# Patient Record
Sex: Female | Born: 1955
Health system: Southern US, Community
[De-identification: ages and names within clinical notes are randomized; demographics above are authoritative.]

---

## 2010-05-12 ENCOUNTER — Emergency Department (HOSPITAL_COMMUNITY): Admission: EM | Admit: 2010-05-12 | Discharge: 2010-05-12 | Payer: Self-pay | Admitting: Emergency Medicine

## 2017-04-17 ENCOUNTER — Emergency Department (HOSPITAL_COMMUNITY)
Admission: EM | Admit: 2017-04-17 | Discharge: 2017-04-17 | Disposition: A | Discharge status: Home / Self Care | Payer: Self-pay | Attending: Emergency Medicine | Admitting: Emergency Medicine

## 2017-04-17 ENCOUNTER — Encounter (HOSPITAL_COMMUNITY): Payer: Self-pay | Admitting: Cardiology

## 2017-04-17 DIAGNOSIS — F17210 Nicotine dependence, cigarettes, uncomplicated: Secondary | ICD-10-CM | POA: Insufficient documentation

## 2017-04-17 DIAGNOSIS — Y999 Unspecified external cause status: Secondary | ICD-10-CM | POA: Insufficient documentation

## 2017-04-17 DIAGNOSIS — S91011A Laceration without foreign body, right ankle, initial encounter: Secondary | ICD-10-CM

## 2017-04-17 DIAGNOSIS — IMO0002 Reserved for concepts with insufficient information to code with codable children: Secondary | ICD-10-CM

## 2017-04-17 DIAGNOSIS — W208XXA Other cause of strike by thrown, projected or falling object, initial encounter: Secondary | ICD-10-CM | POA: Insufficient documentation

## 2017-04-17 DIAGNOSIS — Y9389 Activity, other specified: Secondary | ICD-10-CM | POA: Insufficient documentation

## 2017-04-17 DIAGNOSIS — Y92009 Unspecified place in unspecified non-institutional (private) residence as the place of occurrence of the external cause: Secondary | ICD-10-CM | POA: Insufficient documentation

## 2017-04-17 DIAGNOSIS — S96821A Laceration of other specified muscles and tendons at ankle and foot level, right foot, initial encounter: Secondary | ICD-10-CM | POA: Insufficient documentation

## 2017-04-17 MED ORDER — AMOXICILLIN-POT CLAVULANATE 875-125 MG PO TABS: 1.0000 | ORAL_TABLET | Freq: Two times a day (BID) | ORAL | 0 refills | Status: DC

## 2017-04-17 NOTE — ED Triage Notes (Signed)
Lamp fell and hit right ankle last night.  Pt states she can't get it to stop bleeding.

## 2017-04-17 NOTE — Discharge Instructions (Signed)
As discussed, you have a tendon injury which will require repair.  Call Dr. Roda Shutters for an appointment this week for an office visit.  Take the antibiotics prescribed.  Keep your wound clean and dry and covered.  Wear the cam walker to protect your injury.

## 2017-05-07 NOTE — ED Provider Notes (Signed)
[facility] Provider Note   CSN: [id number] Arrival date & time: 04/17/17  1237     History   Chief Complaint Chief Complaint  Patient presents with  . Laceration    HPI Kimberly Cuevas is a 61 y.o. female who sustained a laceration to her dorsal right ankle more than 12 hours ago.  A lamp fell striking her foot causing injury. She cleaned  And bandaged the wound and planned to left it heal but it will not stop bleeding.  She denies weakness or numbness distal to the injury site. She is unsure of the date of her last tetanus shot. She reports there was broken glass from the light bulb but it was the base of the lamp that caused the injury.  The history is provided by the patient.    History reviewed. No pertinent past medical history.  There are no active problems to display for this patient.   History reviewed. No pertinent surgical history.  OB History    No data available       Home Medications    Prior to Admission medications   Medication Sig Start Date End Date Taking? Authorizing Provider  amoxicillin-clavulanate (AUGMENTIN) 875-125 MG tablet Take 1 tablet by mouth every 12 (twelve) hours. 04/17/17   Burgess Amor, PA-C    Family History History reviewed. No pertinent family history.  Social History Social History  Substance Use Topics  . Smoking status: Current Every Day Smoker  . Smokeless tobacco: Never Used  . Alcohol use No     Allergies   Patient has no known allergies.   Review of Systems Review of Systems  Constitutional: Negative for chills and fever.  Respiratory: Negative for shortness of breath and wheezing.   Skin: Positive for wound.  Neurological: Negative for numbness.     Physical Exam Updated Vital Signs BP 120/70   Pulse 78   Temp 97.9 F (36.6 C) (Temporal)   Resp 20   Ht  (1.651 m)   Wt 49.9 kg (110 lb)   SpO2 100%   BMI 18.30 kg/m   Physical Exam  Constitutional: She is oriented to person, place, and  time. She appears well-developed and well-nourished.  HENT:  Head: Normocephalic.  Cardiovascular: Normal rate.   Pulmonary/Chest: Effort normal.  Musculoskeletal: She exhibits tenderness.       Feet:  Neurological: She is alert and oriented to person, place, and time. No sensory deficit.  Sensation intact in toes, less than 2 sec cap refill.  Skin: Laceration noted.     ED Treatments / Results  Labs (all labs ordered are listed, but only abnormal results are displayed) Labs Reviewed - No data to display  EKG  EKG Interpretation None       Radiology No results found.  Procedures Procedures (including critical care time)  Medications Ordered in ED Medications - No data to display   Initial Impression / Assessment and Plan / ED Course  I have reviewed the triage vital signs and the nursing notes.  Pertinent labs & imaging results that were available during my care of the patient were reviewed by me and considered in my medical decision making (see chart for details).     Pt refused imaging and refused tetanus booster. Discussed dangers of tetanus infection, pt still refusing.  She did agree with dressing and cam walker as recommended by Dr. Roda Shutters after conversation with him.  He will f/u pt in office for further management, pt to call  for appt. Augmentin started. Xeroform and dressing prior to cam walker application.  Wound is not amenable to suture repair at this time given timing, avulsive nature of wound and tendon injury.   Final Clinical Impressions(s) / ED Diagnoses   Final diagnoses:  Tendon laceration  Laceration of right ankle, initial encounter    New Prescriptions Discharge Medication List as of 04/17/2017  3:45 PM    START taking these medications   Details  amoxicillin-clavulanate (AUGMENTIN) 875-125 MG tablet Take 1 tablet by mouth every 12 (twelve) hours., Starting Tue 04/17/2017, Print         Burgess Amor, PA-C 05/07/17 1139    Loren Racer, MD 05/07/17 [phone]

## 2019-09-29 ENCOUNTER — Ambulatory Visit: Payer: Self-pay | Attending: Internal Medicine

## 2019-09-29 DIAGNOSIS — Z23 Encounter for immunization: Secondary | ICD-10-CM | POA: Insufficient documentation

## 2019-09-29 NOTE — Progress Notes (Signed)
   Covid-19 Vaccination Clinic  Name:  [place] Buxbaum    MRN: [id number] DOB: 08/28/1954  09/29/2019  Ms. Pettey was observed post Covid-19 immunization for 15 minutes without incidence. She was provided with Vaccine Information Sheet and instruction to access the V-Safe system.   Ms. Koloski was instructed to call 911 with any severe reactions post vaccine: Marland Kitchen Difficulty breathing  . Swelling of your face and throat  . A fast heartbeat  . A bad rash all over your body  . Dizziness and weakness    Immunizations Administered    Name Date Dose VIS Date Route   Pfizer COVID-19 Vaccine 09/29/2019 12:54 PM 0.3 mL 07/11/2019 Intramuscular   Manufacturer: [employer], [employer]   Lot: [id number]   NDC: [address]-2

## 2019-10-22 ENCOUNTER — Ambulatory Visit: Payer: Self-pay | Attending: Internal Medicine

## 2019-10-22 DIAGNOSIS — Z23 Encounter for immunization: Secondary | ICD-10-CM

## 2019-10-22 NOTE — Progress Notes (Signed)
   Covid-19 Vaccination Clinic  Name:  Kimberly Cuevas    MRN: [id number] DOB: 08/28/1954  10/22/2019  [name] was observed post Covid-19 immunization for 15 minutes without incident. She was provided with Vaccine Information Sheet and instruction to access the V-Safe system.   [name] was instructed to call 911 with any severe reactions post vaccine: Marland Kitchen Difficulty breathing  . Swelling of face and throat  . A fast heartbeat  . A bad rash all over body  . Dizziness and weakness   Immunizations Administered    Name Date Dose VIS Date Route   Pfizer COVID-19 Vaccine 10/22/2019 12:16 PM 0.3 mL 07/11/2019 Intramuscular   Manufacturer: [employer], [employer]   Lot: [id number]   NDC: [address]-1

## 2021-02-02 ENCOUNTER — Ambulatory Visit: Payer: Self-pay | Admitting: Nurse Practitioner

## 2021-02-04 ENCOUNTER — Encounter (HOSPITAL_COMMUNITY): Payer: Self-pay | Admitting: Emergency Medicine

## 2021-02-04 ENCOUNTER — Other Ambulatory Visit: Payer: Self-pay

## 2021-02-04 ENCOUNTER — Ambulatory Visit (HOSPITAL_COMMUNITY)
Admission: EM | Admit: 2021-02-04 | Discharge: 2021-02-04 | Disposition: A | Discharge status: Home / Self Care | Payer: Medicare HMO

## 2021-02-04 DIAGNOSIS — M545 Low back pain, unspecified: Secondary | ICD-10-CM

## 2021-02-04 DIAGNOSIS — R0602 Shortness of breath: Secondary | ICD-10-CM | POA: Diagnosis not present

## 2021-02-04 DIAGNOSIS — R059 Cough, unspecified: Secondary | ICD-10-CM | POA: Diagnosis not present

## 2021-02-04 MED ORDER — LORATADINE 10 MG PO TABS: 10.0000 mg | ORAL_TABLET | Freq: Every day | ORAL | 1 refills | Status: DC

## 2021-02-04 MED ORDER — BUDESONIDE-FORMOTEROL FUMARATE 160-4.5 MCG/ACT IN AERO: 2.0000 | INHALATION_SPRAY | Freq: Two times a day (BID) | RESPIRATORY_TRACT | 1 refills | Status: DC

## 2021-02-04 MED ORDER — MELOXICAM 7.5 MG PO TABS: 7.5000 mg | ORAL_TABLET | Freq: Every day | ORAL | 0 refills | Status: AC

## 2021-02-04 MED ORDER — ALBUTEROL SULFATE HFA 108 (90 BASE) MCG/ACT IN AERS: 2.0000 | INHALATION_SPRAY | RESPIRATORY_TRACT | 0 refills | Status: DC | PRN

## 2021-02-04 MED ORDER — CYCLOBENZAPRINE HCL 10 MG PO TABS: 10.0000 mg | ORAL_TABLET | Freq: Every day | ORAL | 0 refills | Status: DC

## 2021-02-04 NOTE — ED Provider Notes (Signed)
[facility]    CSN: [id number] Arrival date & time: 02/04/21  1410      History   Chief Complaint Chief Complaint  Patient presents with   Cough   Back Pain    HPI Kimberly Cuevas is a 65 y.o. female.   Patient presents with chronic productive cough, intermittent shortness of breath, and intermittent wheezing that has been occurring years, greater than 5. symptoms have worsened over the last few weeks due to the heat.  Has been using albuterol inhaler more frequently but has ran out of medication.  Denies chest tightness, chest pain, fevers, chills, headache, palpitations.  Has history of asthma, has not been evaluated in years.  Concern with intermittent right-sided low back pain that radiates down right leg but does not go all the way down to the toes that has been occurring for 4 months.  Makes it difficult to walk but able to bear weight.  Worsened by lying down.  Nothing makes it to feel better.  Range of motion of back intact.  Has not attempted treatment.  Denies numbness, tingling.  No bowel changes.  History reviewed. No pertinent past medical history.  There are no problems to display for this patient.   History reviewed. No pertinent surgical history.  OB History   No obstetric history on file.      Home Medications    Prior to Admission medications   Medication Sig Start Date End Date Taking? Authorizing Provider  cyclobenzaprine (FLEXERIL) 10 MG tablet Take 1 tablet (10 mg total) by mouth at bedtime. 02/04/21  Yes Lilee Aldea R, NP  meloxicam (MOBIC) 7.5 MG tablet Take 1 tablet (7.5 mg total) by mouth daily. 02/04/21  Yes Lillymae Duet R, NP  albuterol (VENTOLIN HFA) 108 (90 Base) MCG/ACT inhaler Inhale 2 puffs into the lungs every 4 (four) hours as needed for wheezing or shortness of breath. 02/04/21   Valinda Hoar, NP  budesonide-formoterol (SYMBICORT) 160-4.5 MCG/ACT inhaler Inhale 2 puffs into the lungs 2 (two) times daily. 02/04/21   Georgianne Gritz,  Elita Boone, NP  loratadine (CLARITIN) 10 MG tablet Take 1 tablet (10 mg total) by mouth daily. 02/04/21   Valinda Hoar, NP    Family History History reviewed. No pertinent family history.  Social History Social History   Tobacco Use   Smoking status: Some Days    Packs/day: 0.50    Pack years: 0.00    Types: Cigarettes   Smokeless tobacco: Never  Substance Use Topics   Alcohol use: No   Drug use: No     Allergies   Patient has no known allergies.   Review of Systems Review of Systems Deferred HPI   Physical Exam Triage Vital Signs ED Triage Vitals [02/04/21 1429]  Enc Vitals Group     BP 139/74     Pulse Rate 84     Resp 14     Temp 98.9 F (37.2 C)     Temp Source Oral     SpO2 97 %     Weight      Height      Head Circumference      Peak Flow      Pain Score 8     Pain Loc      Pain Edu?      Excl. in GC?    No data found.  Updated Vital Signs BP 139/74 (BP Location: Right Arm)   Pulse 84   Temp 98.9 F (37.2  C) (Oral)   Resp 14   SpO2 97%   Visual Acuity Right Eye Distance:   Left Eye Distance:   Bilateral Distance:    Right Eye Near:   Left Eye Near:    Bilateral Near:     Physical Exam Constitutional:      Appearance: Normal appearance. She is normal weight.  HENT:     Head: Normocephalic.  Eyes:     Extraocular Movements: Extraocular movements intact.  Cardiovascular:     Rate and Rhythm: Normal rate and regular rhythm.     Pulses: Normal pulses.     Heart sounds: Normal heart sounds.  Pulmonary:     Effort: Pulmonary effort is normal.     Breath sounds: Examination of the right-upper field reveals wheezing. Examination of the left-upper field reveals wheezing. Wheezing present.  Musculoskeletal:       Back:     Comments: Tenderness over right area of back, 1 cm small nodule located in right lower region firm and non fluctuant, does not feel fluid-filled  Skin:    General: Skin is warm and dry.  Neurological:      Mental Status: She is alert and oriented to person, place, and time. Mental status is at baseline.  Psychiatric:        Mood and Affect: Mood normal.        Behavior: Behavior normal.     UC Treatments / Results  Labs (all labs ordered are listed, but only abnormal results are displayed) Labs Reviewed - No data to display  EKG   Radiology No results found.  Procedures Procedures (including critical care time)  Medications Ordered in UC Medications - No data to display  Initial Impression / Assessment and Plan / UC Course  I have reviewed the triage vital signs and the nursing notes.  Pertinent labs & imaging results that were available during my care of the patient were reviewed by me and considered in my medical decision making (see chart for details).  Acute right-sided low back pain without sciatica Cough Shortness of breath   1.  Refill Symbicort inhaler, instructed patient on usage 2.  Prescribed albuterol HFA inhaler 2 puffs every 4 hours as needed 3.  Claritin 10 mg daily 4.  Continue use of Mucinex as needed to help with congestion 5.  Meloxicam 7.5 mg daily. 6.  Flexeril 10 mg at bedtime. 7.Follow-up with orthopedic specialist for persistent pain Final Clinical Impressions(s) / UC Diagnoses   Final diagnoses:  Acute right-sided low back pain without sciatica  Cough  Shortness of breath     Discharge Instructions      Use Symbicort inhaler 2 puffs every morning and every evening every day  Can use 2 puffs of albuterol inhaler every 4 hours as needed when you are getting shortness of breath  Take Claritin once every day  Can continue use of Mucinex as needed to help with congestion  Take meloxicam once a day for at least 5 days then can use medication as needed to help with back pain  Can use muscle relaxer at bedtime as needed for comfort, be mindful this may make you drowsy, if this occurs you may break the pill in half and take half of  the  If pain persist please follow-up with orthopedic specialist, information listed below, for further evaluation   ED Prescriptions     Medication Sig Dispense Auth. Provider   budesonide-formoterol (SYMBICORT) 160-4.5 MCG/ACT inhaler  (Status: Discontinued) Inhale 2  puffs into the lungs 2 (two) times daily. 1 each Valinda Hoar, NP   albuterol (VENTOLIN HFA) 108 (90 Base) MCG/ACT inhaler  (Status: Discontinued) Inhale 2 puffs into the lungs every 4 (four) hours as needed for wheezing or shortness of breath. 18 g Shambhavi Salley R, NP   loratadine (CLARITIN) 10 MG tablet  (Status: Discontinued) Take 1 tablet (10 mg total) by mouth daily. 30 tablet Jerritt Cardoza R, NP   albuterol (VENTOLIN HFA) 108 (90 Base) MCG/ACT inhaler  (Status: Discontinued) Inhale 2 puffs into the lungs every 4 (four) hours as needed for wheezing or shortness of breath. 18 g Desera Graffeo R, NP   budesonide-formoterol (SYMBICORT) 160-4.5 MCG/ACT inhaler  (Status: Discontinued) Inhale 2 puffs into the lungs 2 (two) times daily. 1 each Valinda Hoar, NP   loratadine (CLARITIN) 10 MG tablet  (Status: Discontinued) Take 1 tablet (10 mg total) by mouth daily. 30 tablet Raymona Boss R, NP   meloxicam (MOBIC) 7.5 MG tablet Take 1 tablet (7.5 mg total) by mouth daily. 30 tablet Shakeerah Gradel R, NP   cyclobenzaprine (FLEXERIL) 10 MG tablet Take 1 tablet (10 mg total) by mouth at bedtime. 10 tablet Treina Arscott R, NP   albuterol (VENTOLIN HFA) 108 (90 Base) MCG/ACT inhaler Inhale 2 puffs into the lungs every 4 (four) hours as needed for wheezing or shortness of breath. 18 g Rodell Marrs, Hansel Starling R, NP   budesonide-formoterol (SYMBICORT) 160-4.5 MCG/ACT inhaler Inhale 2 puffs into the lungs 2 (two) times daily. 1 each Valinda Hoar, NP   loratadine (CLARITIN) 10 MG tablet Take 1 tablet (10 mg total) by mouth daily. 30 tablet Valinda Hoar, NP      PDMP not reviewed this encounter.   Valinda Hoar,  NP 02/04/21 1550

## 2021-02-04 NOTE — Discharge Instructions (Addendum)
Use Symbicort inhaler 2 puffs every morning and every evening every day  Can use 2 puffs of albuterol inhaler every 4 hours as needed when you are getting shortness of breath  Take Claritin once every day  Can continue use of Mucinex as needed to help with congestion  Take meloxicam once a day for at least 5 days then can use medication as needed to help with back pain  Can use muscle relaxer at bedtime as needed for comfort, be mindful this may make you drowsy, if this occurs you may break the pill in half and take half of the  If pain persist please follow-up with orthopedic specialist, information listed below, for further evaluation

## 2021-02-04 NOTE — ED Triage Notes (Signed)
C/o chronic cough, needs albuterol inhaler refilled. States a couple days ago she coughed up some yellow tinged sputum, but that that occurrence is occasionally considered baseline for her. C/o low back pain starting four months ago that increases with movement/weight bearing, radiates down leg.

## 2021-03-15 DIAGNOSIS — Z20822 Contact with and (suspected) exposure to covid-19: Secondary | ICD-10-CM | POA: Diagnosis not present

## 2021-04-27 ENCOUNTER — Other Ambulatory Visit: Payer: Self-pay

## 2021-04-27 ENCOUNTER — Other Ambulatory Visit: Payer: Self-pay | Admitting: Family Medicine

## 2021-04-27 ENCOUNTER — Ambulatory Visit
Admission: RE | Admit: 2021-04-27 | Discharge: 2021-04-27 | Disposition: A | Discharge status: Home / Self Care | Payer: Medicare HMO | Source: Ambulatory Visit | Attending: Family Medicine | Admitting: Family Medicine

## 2021-04-27 DIAGNOSIS — R635 Abnormal weight gain: Secondary | ICD-10-CM | POA: Diagnosis not present

## 2021-04-27 DIAGNOSIS — R69 Illness, unspecified: Secondary | ICD-10-CM | POA: Diagnosis not present

## 2021-04-27 DIAGNOSIS — M130 Polyarthritis, unspecified: Secondary | ICD-10-CM | POA: Diagnosis not present

## 2021-04-27 DIAGNOSIS — M549 Dorsalgia, unspecified: Secondary | ICD-10-CM

## 2021-04-27 DIAGNOSIS — J449 Chronic obstructive pulmonary disease, unspecified: Secondary | ICD-10-CM

## 2021-04-27 DIAGNOSIS — Z720 Tobacco use: Secondary | ICD-10-CM | POA: Diagnosis not present

## 2021-04-27 DIAGNOSIS — M545 Low back pain, unspecified: Secondary | ICD-10-CM | POA: Diagnosis not present

## 2021-04-27 DIAGNOSIS — J439 Emphysema, unspecified: Secondary | ICD-10-CM | POA: Diagnosis not present

## 2021-04-27 DIAGNOSIS — J441 Chronic obstructive pulmonary disease with (acute) exacerbation: Secondary | ICD-10-CM | POA: Diagnosis not present

## 2021-04-27 DIAGNOSIS — E559 Vitamin D deficiency, unspecified: Secondary | ICD-10-CM | POA: Diagnosis not present

## 2021-04-27 DIAGNOSIS — I10 Essential (primary) hypertension: Secondary | ICD-10-CM | POA: Diagnosis not present

## 2021-05-11 DIAGNOSIS — Z2821 Immunization not carried out because of patient refusal: Secondary | ICD-10-CM | POA: Diagnosis not present

## 2021-05-11 DIAGNOSIS — M130 Polyarthritis, unspecified: Secondary | ICD-10-CM | POA: Diagnosis not present

## 2021-05-11 DIAGNOSIS — Z681 Body mass index (BMI) 19 or less, adult: Secondary | ICD-10-CM | POA: Diagnosis not present

## 2021-05-11 DIAGNOSIS — E559 Vitamin D deficiency, unspecified: Secondary | ICD-10-CM | POA: Diagnosis not present

## 2021-05-11 DIAGNOSIS — R69 Illness, unspecified: Secondary | ICD-10-CM | POA: Diagnosis not present

## 2021-05-11 DIAGNOSIS — R635 Abnormal weight gain: Secondary | ICD-10-CM | POA: Diagnosis not present

## 2021-05-11 DIAGNOSIS — J441 Chronic obstructive pulmonary disease with (acute) exacerbation: Secondary | ICD-10-CM | POA: Diagnosis not present

## 2021-05-11 DIAGNOSIS — Z0000 Encounter for general adult medical examination without abnormal findings: Secondary | ICD-10-CM | POA: Diagnosis not present

## 2021-05-25 DIAGNOSIS — M130 Polyarthritis, unspecified: Secondary | ICD-10-CM | POA: Diagnosis not present

## 2021-05-25 DIAGNOSIS — J441 Chronic obstructive pulmonary disease with (acute) exacerbation: Secondary | ICD-10-CM | POA: Diagnosis not present

## 2021-05-25 DIAGNOSIS — R69 Illness, unspecified: Secondary | ICD-10-CM | POA: Diagnosis not present

## 2021-06-15 DIAGNOSIS — Z1211 Encounter for screening for malignant neoplasm of colon: Secondary | ICD-10-CM | POA: Diagnosis not present

## 2021-06-20 DIAGNOSIS — Z1211 Encounter for screening for malignant neoplasm of colon: Secondary | ICD-10-CM | POA: Diagnosis not present

## 2021-06-27 DIAGNOSIS — M130 Polyarthritis, unspecified: Secondary | ICD-10-CM | POA: Diagnosis not present

## 2021-06-27 DIAGNOSIS — R69 Illness, unspecified: Secondary | ICD-10-CM | POA: Diagnosis not present

## 2021-07-14 ENCOUNTER — Other Ambulatory Visit (HOSPITAL_COMMUNITY): Payer: Self-pay | Admitting: Radiology

## 2021-07-14 DIAGNOSIS — J441 Chronic obstructive pulmonary disease with (acute) exacerbation: Secondary | ICD-10-CM

## 2021-08-11 ENCOUNTER — Encounter (HOSPITAL_COMMUNITY): Payer: Self-pay | Admitting: Emergency Medicine

## 2021-08-11 ENCOUNTER — Emergency Department (HOSPITAL_COMMUNITY): Payer: Medicare Other

## 2021-08-11 ENCOUNTER — Emergency Department (HOSPITAL_COMMUNITY)
Admission: EM | Admit: 2021-08-11 | Discharge: 2021-08-11 | Disposition: A | Discharge status: Home / Self Care | Payer: Medicare Other | Attending: Emergency Medicine | Admitting: Emergency Medicine

## 2021-08-11 ENCOUNTER — Other Ambulatory Visit: Payer: Self-pay

## 2021-08-11 DIAGNOSIS — R059 Cough, unspecified: Secondary | ICD-10-CM | POA: Diagnosis not present

## 2021-08-11 DIAGNOSIS — J439 Emphysema, unspecified: Secondary | ICD-10-CM | POA: Diagnosis not present

## 2021-08-11 DIAGNOSIS — Z20822 Contact with and (suspected) exposure to covid-19: Secondary | ICD-10-CM | POA: Diagnosis not present

## 2021-08-11 DIAGNOSIS — R509 Fever, unspecified: Secondary | ICD-10-CM | POA: Diagnosis not present

## 2021-08-11 DIAGNOSIS — M546 Pain in thoracic spine: Secondary | ICD-10-CM | POA: Insufficient documentation

## 2021-08-11 DIAGNOSIS — Z5321 Procedure and treatment not carried out due to patient leaving prior to being seen by health care provider: Secondary | ICD-10-CM | POA: Diagnosis not present

## 2021-08-11 DIAGNOSIS — R0602 Shortness of breath: Secondary | ICD-10-CM | POA: Diagnosis not present

## 2021-08-11 DIAGNOSIS — J449 Chronic obstructive pulmonary disease, unspecified: Secondary | ICD-10-CM | POA: Diagnosis not present

## 2021-08-11 LAB — COMPREHENSIVE METABOLIC PANEL
ALT: 18 U/L (ref 0–44)
AST: 19 U/L (ref 15–41)
Albumin: 3 g/dL — ABNORMAL LOW (ref 3.5–5.0)
Alkaline Phosphatase: 66 U/L (ref 38–126)
Anion gap: 10 (ref 5–15)
BUN: 9 mg/dL (ref 8–23)
CO2: 26 mmol/L (ref 22–32)
Calcium: 9.1 mg/dL (ref 8.9–10.3)
Chloride: 100 mmol/L (ref 98–111)
Creatinine, Ser: 0.83 mg/dL (ref 0.44–1.00)
GFR, Estimated: >60 mL/min (ref >60)
Glucose, Bld: 84 mg/dL (ref 70–99)
Potassium: 4.1 mmol/L (ref 3.5–5.1)
Sodium: 136 mmol/L (ref 135–145)
Total Bilirubin: 0.4 mg/dL (ref 0.3–1.2)
Total Protein: 7.8 g/dL (ref 6.5–8.1)

## 2021-08-11 LAB — CBC WITH DIFFERENTIAL/PLATELET
Abs Immature Granulocytes: 0.05 10*3/uL (ref 0.00–0.07)
Basophils Absolute: 0.1 10*3/uL (ref 0.0–0.1)
Basophils Relative: 0 %
Eosinophils Absolute: 0.1 10*3/uL (ref 0.0–0.5)
Eosinophils Relative: 1 %
HCT: 39 % (ref 36.0–46.0)
Hemoglobin: 12.5 g/dL (ref 12.0–15.0)
Immature Granulocytes: 0 %
Lymphocytes Relative: 13 %
Lymphs Abs: 1.8 10*3/uL (ref 0.7–4.0)
MCH: 32.4 pg (ref 26.0–34.0)
MCHC: 32.1 g/dL (ref 30.0–36.0)
MCV: 101 fL — ABNORMAL HIGH (ref 80.0–100.0)
Monocytes Absolute: 1 10*3/uL (ref 0.1–1.0)
Monocytes Relative: 7 %
Neutro Abs: 10.4 10*3/uL — ABNORMAL HIGH (ref 1.7–7.7)
Neutrophils Relative %: 79 %
Platelets: 327 10*3/uL (ref 150–400)
RBC: 3.86 MIL/uL — ABNORMAL LOW (ref 3.87–5.11)
RDW: 12.9 % (ref 11.5–15.5)
WBC: 13.3 10*3/uL — ABNORMAL HIGH (ref 4.0–10.5)
nRBC: 0 % (ref 0.0–0.2)

## 2021-08-11 LAB — URINALYSIS, MICROSCOPIC (REFLEX): RBC / HPF: >50 RBC/hpf (ref 0–5)

## 2021-08-11 LAB — URINALYSIS, ROUTINE W REFLEX MICROSCOPIC
Bilirubin Urine: NEGATIVE
Glucose, UA: NEGATIVE mg/dL
Ketones, ur: NEGATIVE mg/dL
Leukocytes,Ua: NEGATIVE
Nitrite: NEGATIVE
Protein, ur: NEGATIVE mg/dL
Specific Gravity, Urine: 1.025 (ref 1.005–1.030)
pH: 6 (ref 5.0–8.0)

## 2021-08-11 LAB — RESP PANEL BY RT-PCR (FLU A&B, COVID) ARPGX2
Influenza A by PCR: NEGATIVE
Influenza B by PCR: NEGATIVE
SARS Coronavirus 2 by RT PCR: NEGATIVE

## 2021-08-11 LAB — LACTIC ACID, PLASMA: Lactic Acid, Venous: 1 mmol/L (ref 0.5–1.9)

## 2021-08-11 MED ORDER — ACETAMINOPHEN 325 MG PO TABS
650.0000 mg | ORAL_TABLET | Freq: Once | ORAL | Status: AC
  Administered 2021-08-11: 650 mg via ORAL
  Filled 2021-08-11: qty 2

## 2021-08-11 NOTE — ED Provider Triage Note (Signed)
Emergency Medicine Provider Triage Evaluation Note  Kimberly Cuevas , a 66 y.o. female  was evaluated in triage.  Pt complains of shortness of breath and a cough.  Symptoms have been present and worsening over the past 3 days.  Patient was unaware she had a fever, but temp of 100.8 on arrival.  Reports cough is productive of phlegm.  Denies chest pain, reports some pain in her thoracic back with coughing.  No abdominal pain or vomiting.  Denies urinary symptoms.  No known sick contacts and patient reports she has had vaccinations for flu and COVID.  History of previous pneumonia  Review of Systems  Positive: Shortness of breath, cough, fever Negative: Abdominal pain, nausea, vomiting, chest pain, dysuria  Physical Exam  BP 138/61 (BP Location: Right Arm)    Pulse 97    Temp (!) 100.8 F (38.2 C) (Oral)    Resp (!) 24    SpO2 96%  Gen:   Awake, chronically ill-appearing Resp:  Frequent coughing during exam, decreased air movement bilaterally, no wheezing or rhonchi. MSK:   Moves extremities without difficulty  Other:    Medical Decision Making  Medically screening exam initiated at 1:42 AM.  Appropriate orders placed.  Kimberly Popov was informed that the remainder of the evaluation will be completed by another provider, this initial triage assessment does not replace that evaluation, and the importance of remaining in the ED until their evaluation is complete.  Work-up initiated, concern for potential pneumonia, versus viral respiratory infection.  Satting well on room air and not in respiratory distress.   Dartha Lodge, [place] 08/11/21 [phone]

## 2021-08-11 NOTE — ED Notes (Signed)
Called x3 for room, pt did not respond.

## 2021-08-11 NOTE — ED Triage Notes (Signed)
Pt reports cough and back pain X 1 week.  She has a fever in triage.  Negative home COVID test last week.

## 2021-09-08 ENCOUNTER — Encounter (HOSPITAL_COMMUNITY): Payer: Self-pay | Admitting: Radiology

## 2021-09-27 DIAGNOSIS — J00 Acute nasopharyngitis [common cold]: Secondary | ICD-10-CM | POA: Diagnosis not present

## 2021-10-05 DIAGNOSIS — J441 Chronic obstructive pulmonary disease with (acute) exacerbation: Secondary | ICD-10-CM | POA: Diagnosis not present

## 2021-10-05 DIAGNOSIS — M130 Polyarthritis, unspecified: Secondary | ICD-10-CM | POA: Diagnosis not present

## 2021-10-05 DIAGNOSIS — Z720 Tobacco use: Secondary | ICD-10-CM | POA: Diagnosis not present

## 2021-10-05 DIAGNOSIS — F5112 Insufficient sleep syndrome: Secondary | ICD-10-CM | POA: Diagnosis not present

## 2021-10-20 DIAGNOSIS — H524 Presbyopia: Secondary | ICD-10-CM | POA: Diagnosis not present

## 2021-10-20 DIAGNOSIS — H25813 Combined forms of age-related cataract, bilateral: Secondary | ICD-10-CM | POA: Diagnosis not present

## 2021-10-21 DIAGNOSIS — J441 Chronic obstructive pulmonary disease with (acute) exacerbation: Secondary | ICD-10-CM | POA: Diagnosis not present

## 2021-10-21 DIAGNOSIS — R053 Chronic cough: Secondary | ICD-10-CM | POA: Diagnosis not present

## 2021-10-21 DIAGNOSIS — E559 Vitamin D deficiency, unspecified: Secondary | ICD-10-CM | POA: Diagnosis not present

## 2021-12-02 DIAGNOSIS — J441 Chronic obstructive pulmonary disease with (acute) exacerbation: Secondary | ICD-10-CM | POA: Diagnosis not present

## 2021-12-02 DIAGNOSIS — J301 Allergic rhinitis due to pollen: Secondary | ICD-10-CM | POA: Diagnosis not present

## 2021-12-02 DIAGNOSIS — R053 Chronic cough: Secondary | ICD-10-CM | POA: Diagnosis not present

## 2022-01-02 DIAGNOSIS — R053 Chronic cough: Secondary | ICD-10-CM | POA: Diagnosis not present

## 2022-01-02 DIAGNOSIS — Z720 Tobacco use: Secondary | ICD-10-CM | POA: Diagnosis not present

## 2022-01-23 DIAGNOSIS — E559 Vitamin D deficiency, unspecified: Secondary | ICD-10-CM | POA: Diagnosis not present

## 2022-01-23 DIAGNOSIS — M130 Polyarthritis, unspecified: Secondary | ICD-10-CM | POA: Diagnosis not present

## 2022-01-23 DIAGNOSIS — F5112 Insufficient sleep syndrome: Secondary | ICD-10-CM | POA: Diagnosis not present

## 2022-01-23 DIAGNOSIS — R053 Chronic cough: Secondary | ICD-10-CM | POA: Diagnosis not present

## 2022-01-23 DIAGNOSIS — J441 Chronic obstructive pulmonary disease with (acute) exacerbation: Secondary | ICD-10-CM | POA: Diagnosis not present

## 2022-01-23 DIAGNOSIS — R638 Other symptoms and signs concerning food and fluid intake: Secondary | ICD-10-CM | POA: Diagnosis not present

## 2022-01-23 DIAGNOSIS — Z0000 Encounter for general adult medical examination without abnormal findings: Secondary | ICD-10-CM | POA: Diagnosis not present

## 2022-01-26 ENCOUNTER — Other Ambulatory Visit: Payer: Self-pay | Admitting: Obstetrics and Gynecology

## 2022-01-26 DIAGNOSIS — Z122 Encounter for screening for malignant neoplasm of respiratory organs: Secondary | ICD-10-CM

## 2022-02-01 ENCOUNTER — Ambulatory Visit (INDEPENDENT_AMBULATORY_CARE_PROVIDER_SITE_OTHER): Payer: Medicare Other

## 2022-02-01 ENCOUNTER — Ambulatory Visit (INDEPENDENT_AMBULATORY_CARE_PROVIDER_SITE_OTHER): Payer: Medicare Other | Admitting: Family Medicine

## 2022-02-01 ENCOUNTER — Encounter: Payer: Self-pay | Admitting: Family Medicine

## 2022-02-01 VITALS — BP 106/58 | HR 85 | Temp 97.8°F | Ht 65.0 in | Wt 111.0 lb

## 2022-02-01 DIAGNOSIS — R053 Chronic cough: Secondary | ICD-10-CM | POA: Insufficient documentation

## 2022-02-01 DIAGNOSIS — J189 Pneumonia, unspecified organism: Secondary | ICD-10-CM

## 2022-02-01 DIAGNOSIS — J449 Chronic obstructive pulmonary disease, unspecified: Secondary | ICD-10-CM

## 2022-02-01 DIAGNOSIS — Z87891 Personal history of nicotine dependence: Secondary | ICD-10-CM | POA: Diagnosis not present

## 2022-02-01 DIAGNOSIS — R059 Cough, unspecified: Secondary | ICD-10-CM | POA: Diagnosis not present

## 2022-02-01 DIAGNOSIS — R0602 Shortness of breath: Secondary | ICD-10-CM

## 2022-02-01 LAB — CBC WITH DIFFERENTIAL/PLATELET
Basophils Absolute: 0.1 10*3/uL (ref 0.0–0.1)
Basophils Relative: 0.5 % (ref 0.0–3.0)
Eosinophils Absolute: 0.2 10*3/uL (ref 0.0–0.7)
Eosinophils Relative: 1.2 % (ref 0.0–5.0)
HCT: 37.8 % (ref 36.0–46.0)
Hemoglobin: 12.2 g/dL (ref 12.0–15.0)
Lymphocytes Relative: 15.9 % (ref 12.0–46.0)
Lymphs Abs: 2.1 10*3/uL (ref 0.7–4.0)
MCHC: 32.4 g/dL (ref 30.0–36.0)
MCV: 97.4 fl (ref 78.0–100.0)
Monocytes Absolute: 1 10*3/uL (ref 0.1–1.0)
Monocytes Relative: 8 % (ref 3.0–12.0)
Neutro Abs: 9.6 10*3/uL — ABNORMAL HIGH (ref 1.4–7.7)
Neutrophils Relative %: 74.4 % (ref 43.0–77.0)
Platelets: 324 10*3/uL (ref 150.0–400.0)
RBC: 3.87 Mil/uL (ref 3.87–5.11)
RDW: 14.2 % (ref 11.5–15.5)
WBC: 12.9 10*3/uL — ABNORMAL HIGH (ref 4.0–10.5)

## 2022-02-01 MED ORDER — AZITHROMYCIN 250 MG PO TABS: ORAL_TABLET | ORAL | 0 refills | Status: AC

## 2022-02-01 MED ORDER — PREDNISONE 20 MG PO TABS: 40.0000 mg | ORAL_TABLET | Freq: Every day | ORAL | 0 refills | Status: DC

## 2022-02-01 NOTE — Assessment & Plan Note (Signed)
Appears uncontrolled. Continue Breztri and Singulair and see pulmonologist. Current CAP per XR today. Will treat with Z-pak and oral steroids.

## 2022-02-01 NOTE — Progress Notes (Signed)
New Patient Office Visit  Subjective    Patient ID: Kimberly Cuevas, female    DOB: 10-20-1955  Age: 66 y.o. MRN: [id number]  CC:  Chief Complaint  Patient presents with   Establish Care    Has has continuous cough for about 6 months, has records with her with what she has tried and wants to find cure. States the coughing has started to effect her breathing.     HPI Kimberly Cuevas presents to establish care. She has been seeing Dr. Parke Simmers. No medical records available today.   Chronic productive cough, intermittent wheezing and shortness of breath x 6 months. Denies fever, chills, dizziness, chest pain, palpitations, abdominal pain, N/V/D, urinary symptoms, LE edema.   States she is here to see if she can get an antibiotic. Unsure if she is establishing care here or going back to see Dr. Parke Simmers. States Dr. Tedra Senegal office is closed today.   Prednisone in March 2023 which helped her some. No recent antibiotics.    Started on [place] in March or April.  Previously on Symbicort and Albuterol.   Recently started on Singulair and does not think it is helping.   Started Megace ES for poor appetite.    Stopped smoking 6 months.  Smoked for 30+  Not a heavy smoker. 1 pack every 3-4 days.    Lives alone. Children and grandchildren.  Clerical and data entry, restaurants.   From [place] originally. Lives in [place], [place], and then [place].    Outpatient Encounter Medications as of 02/01/2022  Medication Sig   azithromycin (ZITHROMAX) 250 MG tablet Take 2 tablets on day 1, then 1 tablet daily on days 2 through 5   BREZTRI AEROSPHERE 160-9-4.8 MCG/ACT AERO SMARTSIG:2 Puff(s) By Mouth Twice Daily   celecoxib (CELEBREX) 200 MG capsule Take 200 mg by mouth 2 (two) times daily.   Loratadine-Pseudoephedrine (CLARITIN-D 12 HOUR PO) Take by mouth.   megestrol (MEGACE ES) 625 MG/5ML suspension Take by mouth.   montelukast (SINGULAIR) 10 MG tablet Take 10 mg by mouth at bedtime.   predniSONE (DELTASONE) 20 MG  tablet Take 2 tablets (40 mg total) by mouth daily with breakfast.   rosuvastatin (CRESTOR) 10 MG tablet Take 10 mg by mouth daily.   meloxicam (MOBIC) 7.5 MG tablet Take 1 tablet (7.5 mg total) by mouth daily. (Patient not taking: Reported on 02/01/2022)   [DISCONTINUED] albuterol (VENTOLIN HFA) 108 (90 Base) MCG/ACT inhaler Inhale 2 puffs into the lungs every 4 (four) hours as needed for wheezing or shortness of breath.   [DISCONTINUED] budesonide-formoterol (SYMBICORT) 160-4.5 MCG/ACT inhaler Inhale 2 puffs into the lungs 2 (two) times daily.   [DISCONTINUED] cyclobenzaprine (FLEXERIL) 10 MG tablet Take 1 tablet (10 mg total) by mouth at bedtime.   [DISCONTINUED] guaiFENesin-codeine 100-10 MG/5ML syrup Take 5 mLs by mouth 3 (three) times daily.   [DISCONTINUED] loratadine (CLARITIN) 10 MG tablet Take 1 tablet (10 mg total) by mouth daily.   No facility-administered encounter medications on file as of 02/01/2022.    History reviewed. No pertinent past medical history.  History reviewed. No pertinent surgical history.  History reviewed. No pertinent family history.  Social History   Socioeconomic History   Marital status: Single    Spouse name: Not on file   Number of children: Not on file   Years of education: Not on file   Highest education level: Not on file  Occupational History   Not on file  Tobacco Use   Smoking status: Some  Days    Packs/day: 0.50    Types: Cigarettes   Smokeless tobacco: Never  Substance and Sexual Activity   Alcohol use: No   Drug use: No   Sexual activity: Not on file  Other Topics Concern   Not on file  Social History Narrative   Not on file   Social Determinants of Health   Financial Resource Strain: Not on file  Food Insecurity: Not on file  Transportation Needs: Not on file  Physical Activity: Not on file  Stress: Not on file  Social Connections: Not on file  Intimate Partner Violence: Not on file    ROS Pertinent positives and  negatives in the history of present illness.      Objective    BP (!) 106/58 (BP Location: Left Arm, Patient Position: Sitting, Cuff Size: Large)   Pulse 85   Temp 97.8 F (36.6 C) (Temporal)   Ht 5\' 5"  (1.651 m)   Wt 111 lb (50.3 kg)   SpO2 99%   BMI 18.47 kg/m   Physical Exam Constitutional:      General: She is not in acute distress.    Appearance: She is ill-appearing.  HENT:     Nose:     Right Sinus: No maxillary sinus tenderness or frontal sinus tenderness.     Left Sinus: No maxillary sinus tenderness or frontal sinus tenderness.     Mouth/Throat:     Lips: Pink.     Mouth: Mucous membranes are moist.     Pharynx: Oropharynx is clear.  Cardiovascular:     Rate and Rhythm: Normal rate and regular rhythm.  Pulmonary:     Effort: Pulmonary effort is normal.     Breath sounds: Examination of the right-upper field reveals wheezing. Examination of the left-upper field reveals wheezing. Examination of the right-lower field reveals decreased breath sounds. Examination of the left-lower field reveals decreased breath sounds. Decreased breath sounds and wheezing present.  Musculoskeletal:     Cervical back: Normal range of motion and neck supple.     Right lower leg: No edema.     Left lower leg: No edema.  Lymphadenopathy:     Cervical: No cervical adenopathy.  Skin:    General: Skin is warm and dry.  Neurological:     General: No focal deficit present.     Mental Status: She is alert and oriented to person, place, and time.  Psychiatric:        Mood and Affect: Mood normal.        Speech: Speech normal.        Behavior: Behavior normal.        Cognition and Memory: Cognition normal.         Assessment & Plan:   Problem List Items Addressed This Visit       Respiratory   Chronic obstructive pulmonary disease (HCC)    Appears uncontrolled. Continue Breztri and Singulair and see pulmonologist. Current CAP per XR today. Will treat with Z-pak and oral  steroids.       Relevant Medications   BREZTRI AEROSPHERE 160-9-4.8 MCG/ACT AERO   montelukast (SINGULAIR) 10 MG tablet   Loratadine-Pseudoephedrine (CLARITIN-D 12 HOUR PO)   azithromycin (ZITHROMAX) 250 MG tablet   predniSONE (DELTASONE) 20 MG tablet   Other Relevant Orders   DG Chest 2 View (Completed)   Ambulatory referral to Pulmonology   Community acquired pneumonia - Primary    Stat CBC and chest XR ordered. Bilateral pneumonia per XR.  Z-pak and oral steroids prescribed. Continue COPD medications. Urgent referral to pulmonologist due to nodular appearance of lung on XR and uncontrolled COPD with chronic cough. CT chest has been ordered by another provider but patient has not had this done. Follow up if worsening or not improving on medications in 2-3 days.        Relevant Medications   BREZTRI AEROSPHERE 160-9-4.8 MCG/ACT AERO   montelukast (SINGULAIR) 10 MG tablet   Loratadine-Pseudoephedrine (CLARITIN-D 12 HOUR PO)   azithromycin (ZITHROMAX) 250 MG tablet     Other   Chronic cough   Relevant Medications   azithromycin (ZITHROMAX) 250 MG tablet   predniSONE (DELTASONE) 20 MG tablet   Other Relevant Orders   DG Chest 2 View (Completed)   CBC with Differential/Platelet (Completed)   Ambulatory referral to Pulmonology   Other Visit Diagnoses     Former smoker       Relevant Orders   DG Chest 2 View (Completed)   Ambulatory referral to Pulmonology   Shortness of breath       Relevant Medications   azithromycin (ZITHROMAX) 250 MG tablet   predniSONE (DELTASONE) 20 MG tablet   Other Relevant Orders   DG Chest 2 View (Completed)   CBC with Differential/Platelet (Completed)   Ambulatory referral to Pulmonology       Return if symptoms worsen or fail to improve.   Hetty Blend, NP-C

## 2022-02-01 NOTE — Progress Notes (Signed)
Her X ray shows probable pneumonia in both lung bases. I do recommend that she start on the antibiotic today. The radiologist also recommends a CT to evaluate a nodular appearance in her right lung. As we discussed in her visit, she has a CT ordered and I have referred her to the lung specialist. I recommend she follow through with these recommendations.

## 2022-02-01 NOTE — Assessment & Plan Note (Signed)
Stat CBC and chest XR ordered. Bilateral pneumonia per XR. Z-pak and oral steroids prescribed. Continue COPD medications. Urgent referral to pulmonologist due to nodular appearance of lung on XR and uncontrolled COPD with chronic cough. CT chest has been ordered by another provider but patient has not had this done. Follow up if worsening or not improving on medications in 2-3 days.

## 2022-02-01 NOTE — Patient Instructions (Addendum)
Please go downstairs for your labs and chest X ray.   Take the antibiotic and oral steroids as prescribed.    I have also referred you to a pulmonologist. They will call you.

## 2022-03-10 ENCOUNTER — Ambulatory Visit (INDEPENDENT_AMBULATORY_CARE_PROVIDER_SITE_OTHER): Payer: Medicare Other | Admitting: Pulmonary Disease

## 2022-03-10 ENCOUNTER — Encounter: Payer: Self-pay | Admitting: Pulmonary Disease

## 2022-03-10 VITALS — BP 120/62 | HR 42 | Ht 65.0 in | Wt 117.0 lb

## 2022-03-10 DIAGNOSIS — R0609 Other forms of dyspnea: Secondary | ICD-10-CM | POA: Diagnosis not present

## 2022-03-10 DIAGNOSIS — J449 Chronic obstructive pulmonary disease, unspecified: Secondary | ICD-10-CM | POA: Diagnosis not present

## 2022-03-10 DIAGNOSIS — R918 Other nonspecific abnormal finding of lung field: Secondary | ICD-10-CM | POA: Diagnosis not present

## 2022-03-10 MED ORDER — AZITHROMYCIN 250 MG PO TABS: 250.0000 mg | ORAL_TABLET | ORAL | 4 refills | Status: AC

## 2022-03-10 MED ORDER — BREZTRI AEROSPHERE 160-9-4.8 MCG/ACT IN AERO: 2.0000 | INHALATION_SPRAY | Freq: Two times a day (BID) | RESPIRATORY_TRACT | 11 refills | Status: AC

## 2022-03-10 NOTE — Patient Instructions (Addendum)
Nice to meet you  I refilled the Markus Daft today, this is a very good medicine  To help with cough and inflammation I prescribed azithromycin 250 mg tablet, 1 tablet on Monday 1 tablet on Wednesday 1 tablet on Friday, 1 tablet 3 times weekly.  I ordered pulmonary function test to be performed at next visit to help define how your lungs are functioning.  This is the only way to diagnose COPD, to see if it is present or not.  I ordered a CT scan to evaluate the small lung nodule seen on your recent chest x-ray.  Return to clinic in 3 months or sooner as needed with Dr. Judeth Horn

## 2022-03-10 NOTE — Progress Notes (Signed)
@Patient  ID: Kimberly Cuevas, female    DOB: [birth year], 66 y.o.   MRN: [id number]  Chief Complaint  Patient presents with   Consult    Pt is here for consult for COPD and cough. Pt states she recently got dx with COPD. Pt is on Breztri and she states that it is very helpful for her symptoms. She is currently out of the inhaler at the moment and needs refills.     Referring provider: [id number], NP-C  HPI:   66 y.o. woman whom are seen in consultation for evaluation of "COPD" and cough.  Note from referring provider reviewed.  Diagnosis COPD is in the chart but she is never had pulmonary function test to define or diagnose COPD.  She has significant smoking history.  Also history of asthma.  Recurrent exacerbations throughout the last few months.  Sounds like bronchitis, worsening cough wheeze shortness of breath.  Cough lingers and is productive for many weeks.  Ongoing now.  Prednisone helped some but for short period of time.  She is on Breztri for a few months.  She finds this beneficial in both her dyspnea exertion and the frequency and severity of her cough.  However cough lingers.  Productive.  Present throughout the day.  Very bothersome.  No time of day when things are better or worse.  No position make things better or worse.  No seasonal environmental factors she can identify that makes it better or worse.  Other medications listed above, no other relieving or exacerbating factors.  Review most recent chest x-ray 01/2022 to the my review interpretation shows hyperinflation, streaky left lower lobe opacity, nodular opacity  scattered throughout most prominent on the right.  PMH: Tobacco abuse in remission, seasonal allergies Surgical history: No significant surgeries Family history: No respiratory illness in first relatives Social history: Former smoker, half pack a day for many years, 20+ pack year, quit early 2023, lives in [place] / Pulmonary  Flowsheets:   ACT:      No data to display          MMRC:     No data to display          Epworth:      No data to display          Tests:   FENO:  No results found for: "NITRICOXIDE"  PFT:     No data to display          WALK:      No data to display          Imaging: Personally reviewed and as per EMR discussion this note No results found.  Lab Results: Personally reviewed CBC    Component Value Date/Time   WBC 12.9 (H) 02/01/2022 1038   RBC 3.87 02/01/2022 1038   HGB 12.2 02/01/2022 1038   HCT 37.8 02/01/2022 1038   PLT 324.0 02/01/2022 1038   MCV 97.4 02/01/2022 1038   MCH 32.4 08/11/2021 0219   MCHC 32.4 02/01/2022 1038   RDW 14.2 02/01/2022 1038   LYMPHSABS 2.1 02/01/2022 1038   MONOABS 1.0 02/01/2022 1038   EOSABS 0.2 02/01/2022 1038   BASOSABS 0.1 02/01/2022 1038    BMET    Component Value Date/Time   NA 136 08/11/2021 0219   K 4.1 08/11/2021 0219   CL 100 08/11/2021 0219   CO2 26 08/11/2021 0219   GLUCOSE 84 08/11/2021 0219   BUN 9 08/11/2021 0219  CREATININE 0.83 08/11/2021 0219   CALCIUM 9.1 08/11/2021 0219   GFRNONAA >60 08/11/2021 0219    BNP No results found for: "BNP"  ProBNP No results found for: "PROBNP"  Specialty Problems       Pulmonary Problems   Chronic cough   Chronic obstructive pulmonary disease (HCC)   Community acquired pneumonia    No Known Allergies  Immunization History  Administered Date(s) Administered   PFIZER(Purple Top)SARS-COV-2 Vaccination 09/29/2019, 10/22/2019    No past medical history on file.  Tobacco History: Social History   Tobacco Use  Smoking Status Former   Packs/day: 0.50   Types: Cigarettes   Quit date: 01/23/2022   Years since quitting: 0.1  Smokeless Tobacco Never   Counseling given: Not Answered   Continue to not smoke  Outpatient Encounter Medications as of 03/10/2022  Medication Sig   azithromycin (ZITHROMAX) 250 MG tablet Take 1 tablet  (250 mg total) by mouth 3 (three) times a week. 1 tab Monday, Wednesday, Friday   celecoxib (CELEBREX) 200 MG capsule Take 200 mg by mouth 2 (two) times daily.   Loratadine-Pseudoephedrine (CLARITIN-D 12 HOUR PO) Take by mouth.   megestrol (MEGACE ES) 625 MG/5ML suspension Take by mouth.   meloxicam (MOBIC) 7.5 MG tablet Take 1 tablet (7.5 mg total) by mouth daily.   montelukast (SINGULAIR) 10 MG tablet Take 10 mg by mouth at bedtime.   rosuvastatin (CRESTOR) 10 MG tablet Take 10 mg by mouth daily.   [DISCONTINUED] BREZTRI AEROSPHERE 160-9-4.8 MCG/ACT AERO SMARTSIG:2 Puff(s) By Mouth Twice Daily   [DISCONTINUED] predniSONE (DELTASONE) 20 MG tablet Take 2 tablets (40 mg total) by mouth daily with breakfast.   Budeson-Glycopyrrol-Formoterol (BREZTRI AEROSPHERE) 160-9-4.8 MCG/ACT AERO Inhale 2 puffs into the lungs 2 (two) times daily.   No facility-administered encounter medications on file as of 03/10/2022.     Review of Systems  Review of Systems  No orthopnea or PND.  No exertional chest pain.  No weight loss, night sweats, fevers.  No gait instability or weakness.  No changes in sensorium.  Comprehensive review of systems otherwise negative. Physical Exam  BP 120/62 (BP Location: Left Arm, Patient Position: Sitting, Cuff Size: Normal)   Pulse (!) 42   Ht 5\' 5"  (1.651 m)   Wt 117 lb (53.1 kg)   SpO2 93%   BMI 19.47 kg/m   Wt Readings from Last 5 Encounters:  03/10/22 117 lb (53.1 kg)  02/01/22 111 lb (50.3 kg)  08/11/21 117 lb (53.1 kg)  04/17/17 110 lb (49.9 kg)    BMI Readings from Last 5 Encounters:  03/10/22 19.47 kg/m  02/01/22 18.47 kg/m  08/11/21 19.47 kg/m  04/17/17 18.30 kg/m     Physical Exam General: Thin, in no acute distress Eyes: EOMI, no icterus Neck: Supple, no JVP Pulmonary: Clear, normal work of breathing Cardiovascular: Warm, no edema Abdomen: Nondistended, bowel sounds present MSK: No synovitis, no joint effusion Neuro: Normal gait, no  weakness Psych: Normal mood, full affect   Assessment & Plan:   Chronic cough: Suspect chronic bronchitis related to cigarette smoking.  With frequent exacerbation requiring 2 rounds of prednisone in the first 8 months of 2023.  Continue triple inhaled therapy with Breztri.  Continue albuterol as needed.  Addition of azithromycin 250 mg 1 tablet Monday Wednesday Friday, 3 times weekly for anti-inflammatory effects and to reduce frequency of exacerbations.  Hope this helps with cough.  Dyspnea on exertion: High suspicion for smoking-related disease.  Chest x-ray is hyperinflated.  PFTs for formal evaluation, not been performed in the past.  This will help [place] assess for the presence of COPD.  Lung nodules: Seen on chest x-ray.  Potentially infectious as timeframe related to bronchitis.  However, given tobacco abuse patient is high risk for lung cancer.  CT scan without contrast ordered for further evaluation.   Return in about 3 months (around 06/10/2022).   Karren Burly, MD 03/10/2022

## 2022-03-21 ENCOUNTER — Encounter (HOSPITAL_COMMUNITY): Payer: Self-pay

## 2022-03-21 ENCOUNTER — Ambulatory Visit (HOSPITAL_COMMUNITY)
Admission: RE | Admit: 2022-03-21 | Discharge: 2022-03-21 | Disposition: A | Discharge status: Home / Self Care | Payer: Medicare Other | Source: Ambulatory Visit | Attending: Pulmonary Disease | Admitting: Pulmonary Disease

## 2022-03-21 DIAGNOSIS — R918 Other nonspecific abnormal finding of lung field: Secondary | ICD-10-CM

## 2022-03-29 ENCOUNTER — Ambulatory Visit (HOSPITAL_COMMUNITY)
Admission: RE | Admit: 2022-03-29 | Discharge: 2022-03-29 | Disposition: A | Discharge status: Home / Self Care | Payer: Medicare Other | Source: Ambulatory Visit | Attending: Pulmonary Disease | Admitting: Pulmonary Disease

## 2022-03-29 DIAGNOSIS — R918 Other nonspecific abnormal finding of lung field: Secondary | ICD-10-CM | POA: Diagnosis not present

## 2022-03-29 DIAGNOSIS — R911 Solitary pulmonary nodule: Secondary | ICD-10-CM | POA: Diagnosis not present

## 2022-03-29 DIAGNOSIS — I700 Atherosclerosis of aorta: Secondary | ICD-10-CM | POA: Diagnosis not present

## 2022-03-29 DIAGNOSIS — J439 Emphysema, unspecified: Secondary | ICD-10-CM | POA: Diagnosis not present

## 2022-03-29 DIAGNOSIS — J479 Bronchiectasis, uncomplicated: Secondary | ICD-10-CM | POA: Diagnosis not present

## 2022-04-04 ENCOUNTER — Other Ambulatory Visit: Payer: Self-pay

## 2022-04-04 DIAGNOSIS — R918 Other nonspecific abnormal finding of lung field: Secondary | ICD-10-CM

## 2022-04-04 NOTE — Progress Notes (Signed)
CT demonstrates small nodules as noted on her most recent chest xray. Recommend follow up  scan in 3 months, late November, early December 2023.

## 2022-04-04 NOTE — Progress Notes (Signed)
Possibly related to inflammation in the chest related to cigarette smoke, even though she quit a while ago. Can try ibuprofen 600 mg up to twice a day AS NEEDED. If worsens should go to ED for imaging and evaluation.

## 2022-05-04 ENCOUNTER — Ambulatory Visit (INDEPENDENT_AMBULATORY_CARE_PROVIDER_SITE_OTHER): Payer: Medicare Other

## 2022-05-04 VITALS — Ht 65.0 in | Wt 120.0 lb

## 2022-05-04 DIAGNOSIS — Z0000 Encounter for general adult medical examination without abnormal findings: Secondary | ICD-10-CM

## 2022-05-04 NOTE — Progress Notes (Signed)
Virtual Visit via Telephone Note  I connected with  Kimberly Cuevas on 05/04/22 at  2:15 PM EDT by telephone and verified that I am speaking with the correct person using two identifiers.  Location: Patient: Home Provider: [facility] Persons participating in the virtual visit: patient/Nurse Health Advisor   I discussed the limitations, risks, security and privacy concerns of performing an evaluation and management service by telephone and the availability of in person appointments. The patient expressed understanding and agreed to proceed.  Interactive audio and video telecommunications were attempted between this nurse and patient, however failed, due to patient having technical difficulties OR patient did not have access to video capability.  We continued and completed visit with audio only.  Some vital signs may be absent or patient reported.   Mickeal Needy, LPN  Subjective:   Kimberly Cuevas is a 66 y.o. female who presents for an Initial Medicare Annual Wellness Visit.  Review of Systems     Cardiac Risk Factors include: advanced age (>51men, >77 women);dyslipidemia     Objective:    Today's Vitals   05/04/22 1426  Weight: 120 lb (54.4 kg)  Height: 5\' 5"  (1.651 m)  PainSc: 0-No pain   Body mass index is 19.97 kg/m.     05/04/2022    2:20 PM  Advanced Directives  Does Patient Have a Medical Advance Directive? Yes  Type of 07/04/2022 of [place];Living will  Copy of Healthcare Power of Attorney in Chart? No - copy requested    Current Medications (verified) Outpatient Encounter Medications as of 05/04/2022  Medication Sig   azithromycin (ZITHROMAX) 250 MG tablet Take 1 tablet (250 mg total) by mouth 3 (three) times a week. 1 tab Monday, Wednesday, Friday   Budeson-Glycopyrrol-Formoterol (BREZTRI AEROSPHERE) 160-9-4.8 MCG/ACT AERO Inhale 2 puffs into the lungs 2 (two) times daily.   celecoxib (CELEBREX) 200 MG capsule Take 200 mg by  mouth 2 (two) times daily.   Loratadine-Pseudoephedrine (CLARITIN-D 12 HOUR PO) Take by mouth.   megestrol (MEGACE ES) 625 MG/5ML suspension Take by mouth.   meloxicam (MOBIC) 7.5 MG tablet Take 1 tablet (7.5 mg total) by mouth daily.   montelukast (SINGULAIR) 10 MG tablet Take 10 mg by mouth at bedtime.   rosuvastatin (CRESTOR) 10 MG tablet Take 10 mg by mouth daily.   No facility-administered encounter medications on file as of 05/04/2022.    Allergies (verified) Patient has no known allergies.   History: History reviewed. No pertinent past medical history. History reviewed. No pertinent surgical history. History reviewed. No pertinent family history. Social History   Socioeconomic History   Marital status: Single    Spouse name: Not on file   Number of children: Not on file   Years of education: Not on file   Highest education level: Not on file  Occupational History   Not on file  Tobacco Use   Smoking status: Former    Packs/day: 0.50    Types: Cigarettes    Quit date: 01/23/2022    Years since quitting: 0.2   Smokeless tobacco: Never  Substance and Sexual Activity   Alcohol use: No   Drug use: No   Sexual activity: Not on file  Other Topics Concern   Not on file  Social History Narrative   Not on file   Social Determinants of Health   Financial Resource Strain: Low Risk  (05/04/2022)   Overall Financial Resource Strain (CARDIA)    Difficulty of Paying Living Expenses: Not  hard at all  Food Insecurity: No Food Insecurity (05/04/2022)   Hunger Vital Sign    Worried About Running Out of Food in the Last Year: Never true    Ran Out of Food in the Last Year: Never true  Transportation Needs: No Transportation Needs (05/04/2022)   PRAPARE - Administrator, Civil Service (Medical): No    Lack of Transportation (Non-Medical): No  Physical Activity: Sufficiently Active (05/04/2022)   Exercise Vital Sign    Days of Exercise per Week: 5 days    Minutes of  Exercise per Session: 30 min  Stress: No Stress Concern Present (05/04/2022)   [employer] of Occupational Health - Occupational Stress Questionnaire    Feeling of Stress : Not at all  Social Connections: Moderately Integrated (05/04/2022)   Social Connection and Isolation Panel [NHANES]    Frequency of Communication with Friends and Family: More than three times a week    Frequency of Social Gatherings with Friends and Family: More than three times a week    Attends Religious Services: More than 4 times per year    Active Member of [employer] or Organizations: Yes    Attends Engineer, structural: More than 4 times per year    Marital Status: Never married    Tobacco Counseling Counseling given: Not Answered   Clinical Intake:  Pre-visit preparation completed: Yes  Pain : No/denies pain Pain Score: 0-No pain     BMI - recorded: 19.97 Nutritional Status: BMI of 19-24  Normal Nutritional Risks: None Diabetes: No CBG done?: No Did pt. bring in CBG monitor from home?: No  How often do you need to have someone help you when you read instructions, pamphlets, or other written materials from your doctor or pharmacy?: 1 - Never What is the last grade level you completed in school?: [employer]; Associates' Degree  Diabetic? no  Interpreter Needed?: No  Information entered by :: Susie Cassette, LPN.   Activities of Daily Living    05/04/2022    2:30 PM  In your present state of health, do you have any difficulty performing the following activities:  Hearing? 0  Vision? 0  Difficulty concentrating or making decisions? 0  Walking or climbing stairs? 0  Dressing or bathing? 0  Doing errands, shopping? 0  Preparing Food and eating ? N  Using the Toilet? N  In the past six months, have you accidently leaked urine? N  Do you have problems with loss of bowel control? N  Managing your Medications? N  Managing your Finances? N  Housekeeping or managing your Housekeeping? N     Patient Care Team: Avanell Shackleton, NP-C as PCP - General (Family Medicine) [facility] as Consulting Physician (Ophthalmology)  Indicate any recent Medical Services you may have received from other than [facility] providers in the past year (date may be approximate).     Assessment:   This is a routine wellness examination for Kimberly.  Hearing/Vision screen Hearing Screening - Comments:: Denies hearing difficulties   Vision Screening - Comments:: Wears rx glasses - up to date with routine eye exams with [facility]   Dietary issues and exercise activities discussed: Current Exercise Habits: Home exercise routine, Type of exercise: walking, Time (Minutes): 30, Frequency (Times/Week): 5, Weekly Exercise (Minutes/Week): 150, Intensity: Mild, Exercise limited by: respiratory conditions(s)   Goals Addressed             This Visit's Progress    Continue  to exercise (Recommend 30 minutes of walking everyday, or 3 hours every week).        Depression Screen    05/04/2022    2:28 PM  PHQ 2/9 Scores  PHQ - 2 Score 0    Fall Risk    05/04/2022    2:20 PM  Fall Risk   Falls in the past year? 0  Number falls in past yr: 0  Injury with Fall? 0  Risk for fall due to : No Fall Risks  Follow up Falls prevention discussed    FALL RISK PREVENTION PERTAINING TO THE HOME:  Any stairs in or around the home? Yes  If so, are there any without handrails? No  Home free of loose throw rugs in walkways, pet beds, electrical cords, etc? Yes  Adequate lighting in your home to reduce risk of falls? Yes   ASSISTIVE DEVICES UTILIZED TO PREVENT FALLS:  Life alert? No  Use of a cane, walker or w/c? No  Grab bars in the bathroom? Yes  Shower chair or bench in shower? No  Elevated toilet seat or a handicapped toilet? No   TIMED UP AND GO:  Was the test performed? No . Phone Visit.  Cognitive Function:        05/04/2022    2:31 PM  6CIT Screen  What Year? 0  points  What month? 0 points  What time? 0 points  Count back from 20 0 points  Months in reverse 0 points  Repeat phrase 0 points  Total Score 0 points    Immunizations Immunization History  Administered Date(s) Administered   PFIZER(Purple Top)SARS-COV-2 Vaccination 09/29/2019, 10/22/2019    TDAP status: Due, Education has been provided regarding the importance of this vaccine. Advised may receive this vaccine at local pharmacy or Health Dept. Aware to provide a copy of the vaccination record if obtained from local pharmacy or Health Dept. Verbalized acceptance and understanding.  Flu Vaccine status: Due, Education has been provided regarding the importance of this vaccine. Advised may receive this vaccine at local pharmacy or Health Dept. Aware to provide a copy of the vaccination record if obtained from local pharmacy or Health Dept. Verbalized acceptance and understanding.  Pneumococcal vaccine status: Due, Education has been provided regarding the importance of this vaccine. Advised may receive this vaccine at local pharmacy or Health Dept. Aware to provide a copy of the vaccination record if obtained from local pharmacy or Health Dept. Verbalized acceptance and understanding.  Covid-19 vaccine status: Completed vaccines  Qualifies for Shingles Vaccine? Yes   Zostavax completed No   Shingrix Completed?: No.    Education has been provided regarding the importance of this vaccine. Patient has been advised to call insurance company to determine out of pocket expense if they have not yet received this vaccine. Advised may also receive vaccine at local pharmacy or Health Dept. Verbalized acceptance and understanding.  Screening Tests Health Maintenance  Topic Date Due   TETANUS/TDAP  Never done   MAMMOGRAM  Never done   Zoster Vaccines- Shingrix (1 of 2) Never done   Pneumonia Vaccine 68+ Years old (1 - PCV) Never done   DEXA SCAN  Never done   COVID-19 Vaccine (3 - Pfizer series)  12/17/2019   INFLUENZA VACCINE  Never done   COLONOSCOPY (Pts 45-17yrs Insurance coverage will need to be confirmed)  06/21/2031   Hepatitis C Screening  Completed   HPV VACCINES  Aged Out    Health Maintenance  Health  Maintenance Due  Topic Date Due   TETANUS/TDAP  Never done   MAMMOGRAM  Never done   Zoster Vaccines- Shingrix (1 of 2) Never done   Pneumonia Vaccine 55+ Years old (1 - PCV) Never done   DEXA SCAN  Never done   COVID-19 Vaccine (3 - Pfizer series) 12/17/2019   INFLUENZA VACCINE  Never done    Colorectal screening: patient declined  Mammogram: patient declined  Bone Density: patient declined  Lung Cancer Screening: (Low Dose CT Chest recommended if Age 90-80 years, 30 pack-year currently smoking OR have quit w/in 15years.) does not qualify.   Lung Cancer Screening Referral: no  Additional Screening:  Hepatitis C Screening: does qualify; Completed 09/28/2021  Vision Screening: Recommended annual ophthalmology exams for early detection of glaucoma and other disorders of the eye. Is the patient up to date with their annual eye exam?  Yes  Who is the provider or what is the name of the office in which the patient attends annual eye exams? [facility] If pt is not established with a provider, would they like to be referred to a provider to establish care? No .   Dental Screening: Recommended annual dental exams for proper oral hygiene  Community Resource Referral / Chronic Care Management: CRR required this visit?  No   CCM required this visit?  No      Plan:     I have personally reviewed and noted the following in the patient's chart:   Medical and social history Use of alcohol, tobacco or illicit drugs  Current medications and supplements including opioid prescriptions. Patient is not currently taking opioid prescriptions. Functional ability and status Nutritional status Physical activity Advanced directives List of other  physicians Hospitalizations, surgeries, and ER visits in previous 12 months Vitals Screenings to include cognitive, depression, and falls Referrals and appointments  In addition, I have reviewed and discussed with patient certain preventive protocols, quality metrics, and best practice recommendations. A written personalized care plan for preventive services as well as general preventive health recommendations were provided to patient.     Sheral Flow, LPN   [id number]   Nurse Notes: N/A

## 2022-05-04 NOTE — Patient Instructions (Signed)
Ms. Kimberly Cuevas , Thank you for taking time to come for your Medicare Wellness Visit. I appreciate your ongoing commitment to your health goals. Please review the following plan we discussed and let me know if I can assist you in the future.   These are the goals we discussed:  Goals      Continue to exercise (Recommend 30 minutes of walking everyday, or 3 hours every week).        This is a list of the screening recommended for you and due dates:  Health Maintenance  Topic Date Due   Tetanus Vaccine  Never done   Mammogram  Never done   Zoster (Shingles) Vaccine (1 of 2) Never done   Pneumonia Vaccine (1 - PCV) Never done   DEXA scan (bone density measurement)  Never done   COVID-19 Vaccine (3 - Pfizer series) 12/17/2019   Flu Shot  Never done   Colon Cancer Screening  06/21/2031   Hepatitis C Screening: USPSTF Recommendation to screen - Ages 18-79 yo.  Completed   HPV Vaccine  Aged Out    Advanced directives: Yes  Conditions/risks identified: Yes  Next appointment: Follow up in one year for your annual wellness visit.   Preventive Care 38 Years and Older, Female Preventive care refers to lifestyle choices and visits with your health care provider that can promote health and wellness. What does preventive care include? A yearly physical exam. This is also called an annual well check. Dental exams once or twice a year. Routine eye exams. Ask your health care provider how often you should have your eyes checked. Personal lifestyle choices, including: Daily care of your teeth and gums. Regular physical activity. Eating a healthy diet. Avoiding tobacco and drug use. Limiting alcohol use. Practicing safe sex. Taking low-dose aspirin every day. Taking vitamin and mineral supplements as recommended by your health care provider. What happens during an annual well check? The services and screenings done by your health care provider during your annual well check will depend on your  age, overall health, lifestyle risk factors, and family history of disease. Counseling  Your health care provider may ask you questions about your: Alcohol use. Tobacco use. Drug use. Emotional well-being. Home and relationship well-being. Sexual activity. Eating habits. History of falls. Memory and ability to understand (cognition). Work and work Statistician. Reproductive health. Screening  You may have the following tests or measurements: Height, weight, and BMI. Blood pressure. Lipid and cholesterol levels. These may be checked every 5 years, or more frequently if you are over 25 years old. Skin check. Lung cancer screening. You may have this screening every year starting at age 65 if you have a 30-pack-year history of smoking and currently smoke or have quit within the past 15 years. Fecal occult blood test (FOBT) of the stool. You may have this test every year starting at age 68. Flexible sigmoidoscopy or colonoscopy. You may have a sigmoidoscopy every 5 years or a colonoscopy every 10 years starting at age 68. Hepatitis C blood test. Hepatitis B blood test. Sexually transmitted disease (STD) testing. Diabetes screening. This is done by checking your blood sugar (glucose) after you have not eaten for a while (fasting). You may have this done every 1-3 years. Bone density scan. This is done to screen for osteoporosis. You may have this done starting at age 68. Mammogram. This may be done every 1-2 years. Talk to your health care provider about how often you should have regular mammograms. Talk  with your health care provider about your test results, treatment options, and if necessary, the need for more tests. Vaccines  Your health care provider may recommend certain vaccines, such as: Influenza vaccine. This is recommended every year. Tetanus, diphtheria, and acellular pertussis (Tdap, Td) vaccine. You may need a Td booster every 10 years. Zoster vaccine. You may need this after  age 68. Pneumococcal 13-valent conjugate (PCV13) vaccine. One dose is recommended after age 68. Pneumococcal polysaccharide (PPSV23) vaccine. One dose is recommended after age 19. Talk to your health care provider about which screenings and vaccines you need and how often you need them. This information is not intended to replace advice given to you by your health care provider. Make sure you discuss any questions you have with your health care provider. Document Released: 08/13/2015 Document Revised: 04/05/2016 Document Reviewed: 05/18/2015 Elsevier Interactive Patient Education  2017 [place] Prevention in the Home Falls can cause injuries. They can happen to people of all ages. There are many things you can do to make your home safe and to help prevent falls. What can I do on the outside of my home? Regularly fix the edges of walkways and driveways and fix any cracks. Remove anything that might make you trip as you walk through a door, such as a raised step or threshold. Trim any bushes or trees on the path to your home. Use bright outdoor lighting. Clear any walking paths of anything that might make someone trip, such as rocks or tools. Regularly check to see if handrails are loose or broken. Make sure that both sides of any steps have handrails. Any raised decks and porches should have guardrails on the edges. Have any leaves, snow, or ice cleared regularly. Use sand or salt on walking paths during winter. Clean up any spills in your garage right away. This includes oil or grease spills. What can I do in the bathroom? Use night lights. Install grab bars by the toilet and in the tub and shower. Do not use towel bars as grab bars. Use non-skid mats or decals in the tub or shower. If you need to sit down in the shower, use a plastic, non-slip stool. Keep the floor dry. Clean up any water that spills on the floor as soon as it happens. Remove soap buildup in the tub or shower  regularly. Attach bath mats securely with double-sided non-slip rug tape. Do not have throw rugs and other things on the floor that can make you trip. What can I do in the bedroom? Use night lights. Make sure that you have a light by your bed that is easy to reach. Do not use any sheets or blankets that are too big for your bed. They should not hang down onto the floor. Have a firm chair that has side arms. You can use this for support while you get dressed. Do not have throw rugs and other things on the floor that can make you trip. What can I do in the kitchen? Clean up any spills right away. Avoid walking on wet floors. Keep items that you use a lot in easy-to-reach places. If you need to reach something above you, use a strong step stool that has a grab bar. Keep electrical cords out of the way. Do not use floor polish or wax that makes floors slippery. If you must use wax, use non-skid floor wax. Do not have throw rugs and other things on the floor that can make you  trip. What can I do with my stairs? Do not leave any items on the stairs. Make sure that there are handrails on both sides of the stairs and use them. Fix handrails that are broken or loose. Make sure that handrails are as long as the stairways. Check any carpeting to make sure that it is firmly attached to the stairs. Fix any carpet that is loose or worn. Avoid having throw rugs at the top or bottom of the stairs. If you do have throw rugs, attach them to the floor with carpet tape. Make sure that you have a light switch at the top of the stairs and the bottom of the stairs. If you do not have them, ask someone to add them for you. What else can I do to help prevent falls? Wear shoes that: Do not have high heels. Have rubber bottoms. Are comfortable and fit you well. Are closed at the toe. Do not wear sandals. If you use a stepladder: Make sure that it is fully opened. Do not climb a closed stepladder. Make sure that  both sides of the stepladder are locked into place. Ask someone to hold it for you, if possible. Clearly mark and make sure that you can see: Any grab bars or handrails. First and last steps. Where the edge of each step is. Use tools that help you move around (mobility aids) if they are needed. These include: Canes. Walkers. Scooters. Crutches. Turn on the lights when you go into a dark area. Replace any light bulbs as soon as they burn out. Set up your furniture so you have a clear path. Avoid moving your furniture around. If any of your floors are uneven, fix them. If there are any pets around you, be aware of where they are. Review your medicines with your doctor. Some medicines can make you feel dizzy. This can increase your chance of falling. Ask your doctor what other things that you can do to help prevent falls. This information is not intended to replace advice given to you by your health care provider. Make sure you discuss any questions you have with your health care provider. Document Released: 05/13/2009 Document Revised: 12/23/2015 Document Reviewed: 08/21/2014 Elsevier Interactive Patient Education  2017 [employer].

## 2022-07-04 ENCOUNTER — Ambulatory Visit (HOSPITAL_COMMUNITY): Payer: Medicare Other | Attending: Pulmonary Disease

## 2022-09-06 IMAGING — CR DG CHEST 2V
2 series · 2 of 2 positions shown · non-contrast
Comparison: PA Lat chest 04/27/2021.

CLINICAL DATA: Shortness of breath, coughing and fever.

EXAM:
CHEST - 2 VIEW

[chest pa]
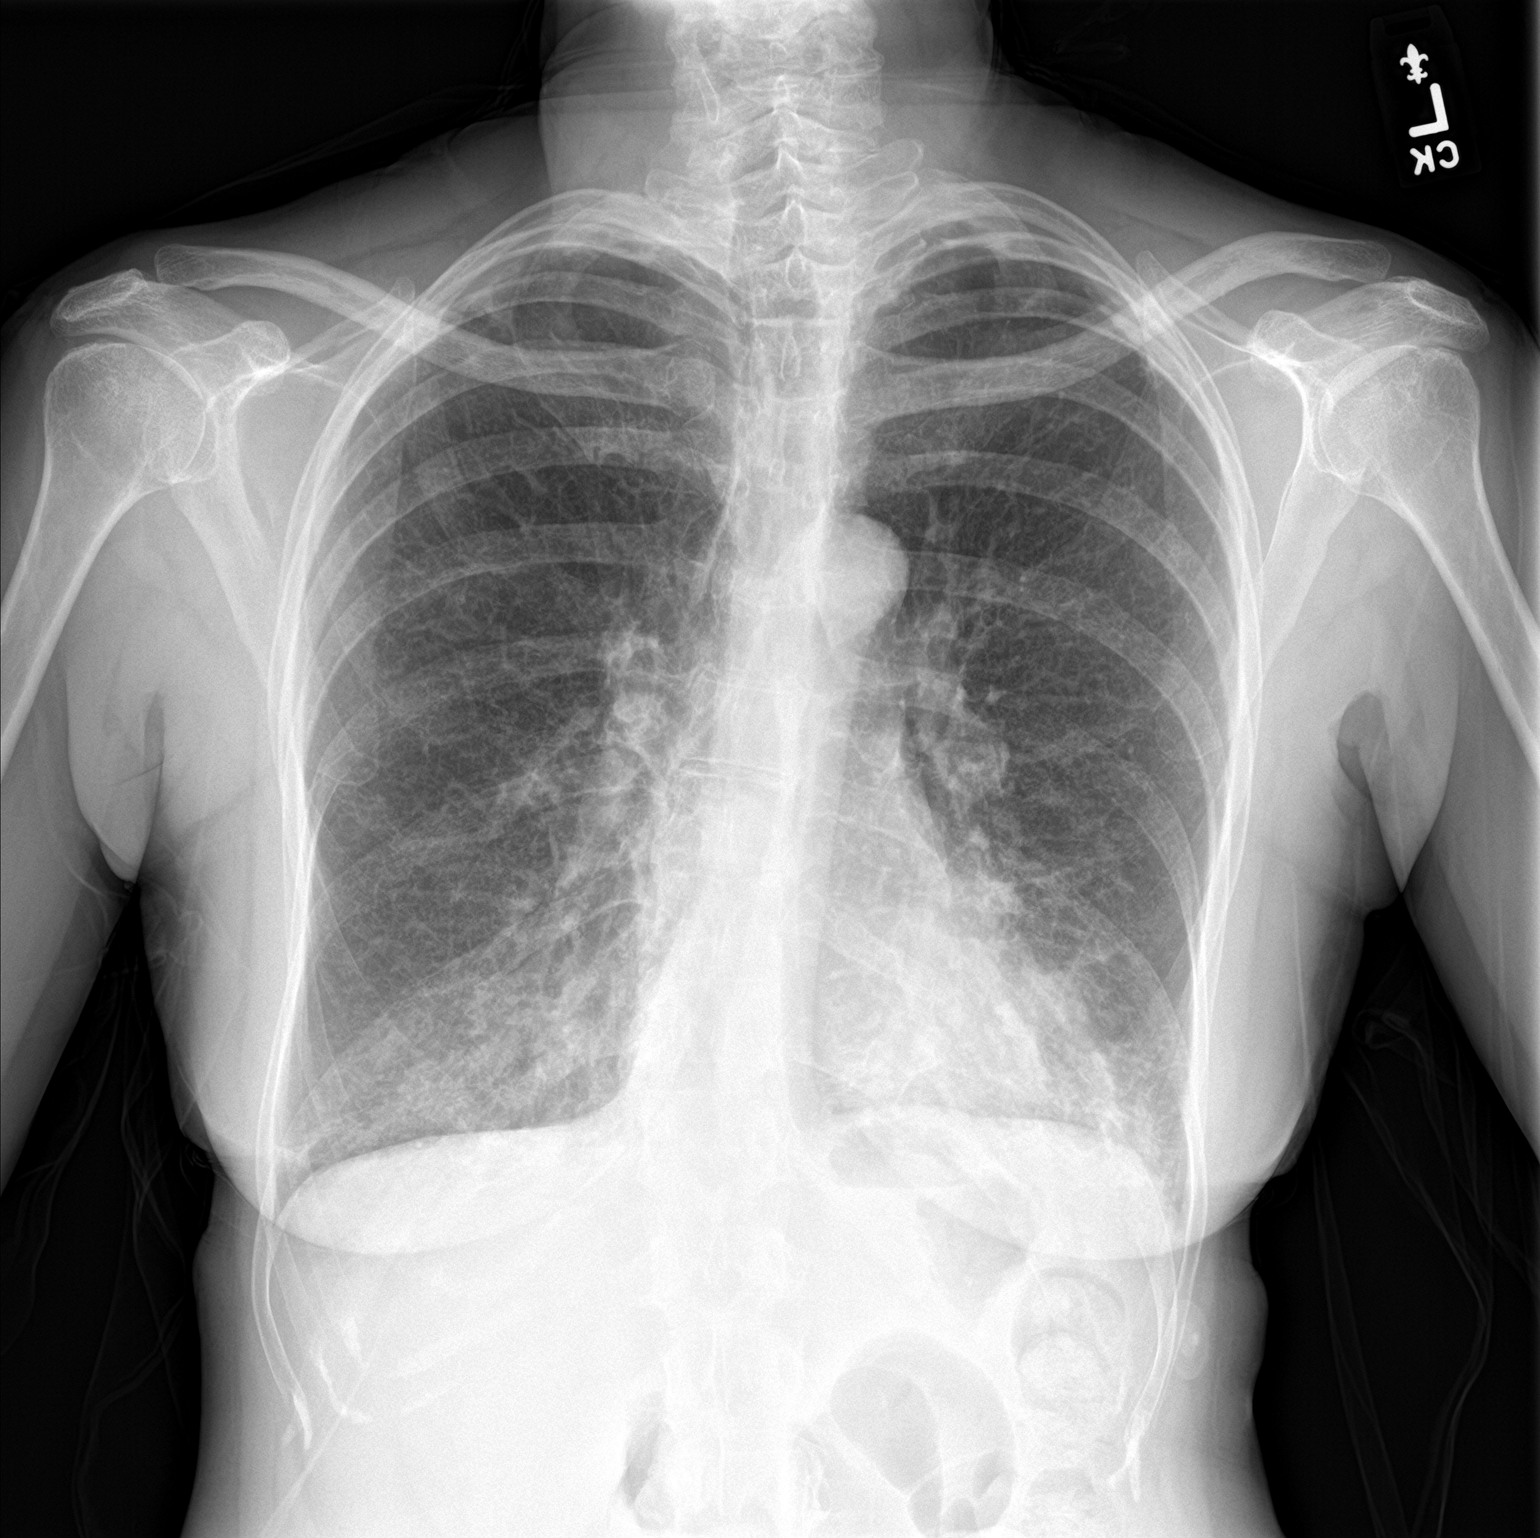

[chest lat]
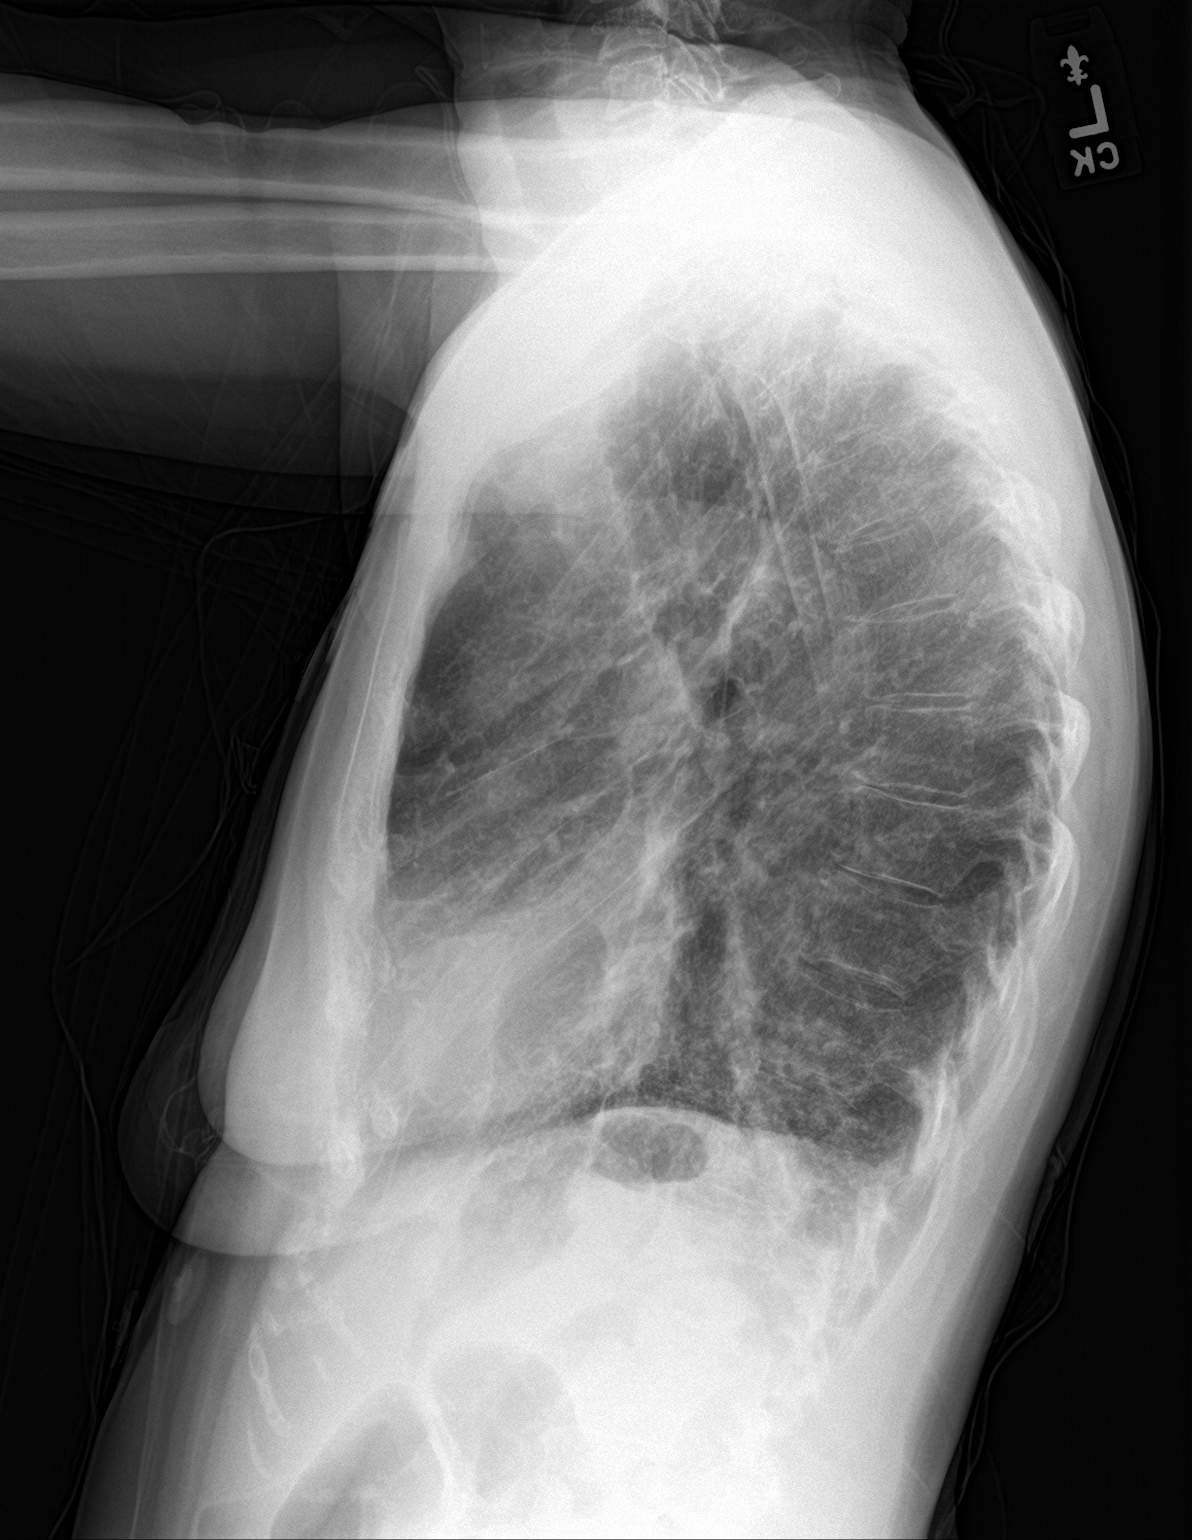

[2 of 2 positions shown; findings below may reference images not displayed]

FINDINGS: The lungs emphysematous, with again noted biapical symmetric
scarring changes. Patchy hazy opacities have developed in both lower
lung fields consistent with multilobar pneumonia versus aspiration.

The mid and upper lung fields are clear of infiltrates. The cardiac
size is normal. Stable mediastinum. Osteopenia and mild thoracic
kyphodextroscoliosis.
IMPRESSION: Bilateral lower zonal infiltrates. On the right this is probably in
the lower lobe and on the left appears confined to the lingular
segments of the left upper lobe. Background COPD. Etiology could be
pneumonia or aspiration. Clinical correlation and radiographic
follow-up recommended to ensure clearing.

## 2023-05-28 DIAGNOSIS — J449 Chronic obstructive pulmonary disease, unspecified: Secondary | ICD-10-CM | POA: Diagnosis not present

## 2023-05-28 DIAGNOSIS — F17211 Nicotine dependence, cigarettes, in remission: Secondary | ICD-10-CM | POA: Diagnosis not present

## 2023-05-28 DIAGNOSIS — I700 Atherosclerosis of aorta: Secondary | ICD-10-CM | POA: Diagnosis not present

## 2023-05-28 DIAGNOSIS — Z681 Body mass index (BMI) 19 or less, adult: Secondary | ICD-10-CM | POA: Diagnosis not present

## 2023-05-28 DIAGNOSIS — Z008 Encounter for other general examination: Secondary | ICD-10-CM | POA: Diagnosis not present

## 2023-10-08 DIAGNOSIS — Z681 Body mass index (BMI) 19 or less, adult: Secondary | ICD-10-CM | POA: Diagnosis not present

## 2023-10-08 DIAGNOSIS — Z008 Encounter for other general examination: Secondary | ICD-10-CM | POA: Diagnosis not present

## 2023-10-08 DIAGNOSIS — J449 Chronic obstructive pulmonary disease, unspecified: Secondary | ICD-10-CM | POA: Diagnosis not present

## 2023-10-31 ENCOUNTER — Other Ambulatory Visit: Payer: Self-pay | Admitting: Family Medicine

## 2023-10-31 ENCOUNTER — Other Ambulatory Visit (HOSPITAL_COMMUNITY)
Admission: RE | Admit: 2023-10-31 | Discharge: 2023-10-31 | Disposition: A | Discharge status: Home / Self Care | Source: Ambulatory Visit | Attending: Family Medicine | Admitting: Family Medicine

## 2023-10-31 ENCOUNTER — Other Ambulatory Visit (HOSPITAL_COMMUNITY): Admission: RE | Admit: 2023-10-31 | Source: Ambulatory Visit

## 2023-10-31 DIAGNOSIS — Z720 Tobacco use: Secondary | ICD-10-CM | POA: Diagnosis not present

## 2023-10-31 DIAGNOSIS — E781 Pure hyperglyceridemia: Secondary | ICD-10-CM | POA: Diagnosis not present

## 2023-10-31 DIAGNOSIS — Z1272 Encounter for screening for malignant neoplasm of vagina: Secondary | ICD-10-CM | POA: Insufficient documentation

## 2023-10-31 DIAGNOSIS — J441 Chronic obstructive pulmonary disease with (acute) exacerbation: Secondary | ICD-10-CM | POA: Diagnosis not present

## 2023-10-31 DIAGNOSIS — N760 Acute vaginitis: Secondary | ICD-10-CM | POA: Diagnosis not present

## 2023-11-05 LAB — CYTOLOGY - PAP
Adequacy: ABSENT
Chlamydia: NEGATIVE
Comment: NEGATIVE
Comment: NEGATIVE
Comment: NORMAL
Diagnosis: NEGATIVE
Neisseria Gonorrhea: NEGATIVE
Trichomonas: POSITIVE — AB

## 2023-11-14 DIAGNOSIS — M810 Age-related osteoporosis without current pathological fracture: Secondary | ICD-10-CM | POA: Diagnosis not present

## 2023-11-14 DIAGNOSIS — Z1231 Encounter for screening mammogram for malignant neoplasm of breast: Secondary | ICD-10-CM | POA: Diagnosis not present

## 2024-01-31 DIAGNOSIS — N6324 Unspecified lump in the left breast, lower inner quadrant: Secondary | ICD-10-CM | POA: Diagnosis not present

## 2024-01-31 DIAGNOSIS — R922 Inconclusive mammogram: Secondary | ICD-10-CM | POA: Diagnosis not present

## 2024-02-05 DIAGNOSIS — N6012 Diffuse cystic mastopathy of left breast: Secondary | ICD-10-CM | POA: Diagnosis not present

## 2024-02-05 DIAGNOSIS — N6324 Unspecified lump in the left breast, lower inner quadrant: Secondary | ICD-10-CM | POA: Diagnosis not present
